# Patient Record
Sex: Male | Born: 1950 | Hispanic: No | Marital: Married | Smoking: Current every day smoker
Health system: Southern US, Community
[De-identification: ages and names within clinical notes are randomized; demographics above are authoritative.]

---

## 2014-09-28 ENCOUNTER — Other Ambulatory Visit: Payer: Self-pay | Admitting: Family Medicine

## 2014-09-28 DIAGNOSIS — M792 Neuralgia and neuritis, unspecified: Secondary | ICD-10-CM

## 2014-09-28 DIAGNOSIS — M542 Cervicalgia: Secondary | ICD-10-CM

## 2014-10-05 ENCOUNTER — Ambulatory Visit
Admission: RE | Admit: 2014-10-05 | Discharge: 2014-10-05 | Disposition: A | Discharge status: Home / Self Care | Payer: BLUE CROSS/BLUE SHIELD | Source: Ambulatory Visit | Attending: Family Medicine | Admitting: Family Medicine

## 2014-10-05 DIAGNOSIS — D170 Benign lipomatous neoplasm of skin and subcutaneous tissue of head, face and neck: Secondary | ICD-10-CM | POA: Insufficient documentation

## 2014-10-05 DIAGNOSIS — M792 Neuralgia and neuritis, unspecified: Secondary | ICD-10-CM | POA: Insufficient documentation

## 2014-10-05 DIAGNOSIS — M542 Cervicalgia: Secondary | ICD-10-CM | POA: Diagnosis present

## 2014-10-05 DIAGNOSIS — M47892 Other spondylosis, cervical region: Secondary | ICD-10-CM | POA: Diagnosis not present

## 2016-03-06 ENCOUNTER — Other Ambulatory Visit: Payer: Self-pay | Admitting: Orthopedic Surgery

## 2016-03-06 DIAGNOSIS — M9951 Intervertebral disc stenosis of neural canal of cervical region: Secondary | ICD-10-CM

## 2016-03-06 DIAGNOSIS — M9941 Connective tissue stenosis of neural canal of cervical region: Secondary | ICD-10-CM

## 2016-03-17 ENCOUNTER — Ambulatory Visit
Admission: RE | Admit: 2016-03-17 | Discharge: 2016-03-17 | Disposition: A | Discharge status: Home / Self Care | Payer: BLUE CROSS/BLUE SHIELD | Source: Ambulatory Visit | Attending: Orthopedic Surgery | Admitting: Orthopedic Surgery

## 2016-03-17 DIAGNOSIS — M9951 Intervertebral disc stenosis of neural canal of cervical region: Secondary | ICD-10-CM | POA: Insufficient documentation

## 2016-03-17 DIAGNOSIS — M4802 Spinal stenosis, cervical region: Secondary | ICD-10-CM | POA: Insufficient documentation

## 2016-03-17 DIAGNOSIS — M50223 Other cervical disc displacement at C6-C7 level: Secondary | ICD-10-CM | POA: Insufficient documentation

## 2016-03-17 DIAGNOSIS — M9941 Connective tissue stenosis of neural canal of cervical region: Secondary | ICD-10-CM | POA: Diagnosis present

## 2016-03-17 DIAGNOSIS — M50323 Other cervical disc degeneration at C6-C7 level: Secondary | ICD-10-CM | POA: Insufficient documentation

## 2016-08-21 ENCOUNTER — Ambulatory Visit
Admission: EM | Admit: 2016-08-21 | Discharge: 2016-08-21 | Disposition: A | Discharge status: Home / Self Care | Payer: BLUE CROSS/BLUE SHIELD | Attending: Family Medicine | Admitting: Family Medicine

## 2016-08-21 DIAGNOSIS — K120 Recurrent oral aphthae: Secondary | ICD-10-CM

## 2016-08-21 MED ORDER — AMOXICILLIN 875 MG PO TABS: 875.0000 mg | ORAL_TABLET | Freq: Two times a day (BID) | ORAL | 0 refills | Status: DC

## 2016-08-21 NOTE — ED Triage Notes (Signed)
66 year old Caucasian male is here today with complaints lower right side mouth pain that started 4-5 days ago. He states he had a deep cleaning  done at his dentist office last Thursday. He states he thought his gum may have been scratched from having xrays done and thought nothing of it until it started irritating him.

## 2016-08-21 NOTE — ED Provider Notes (Signed)
[facility]    CSN: [id number] Arrival date & time: 08/21/16  1402     History   Chief Complaint Chief Complaint  Patient presents with  . Oral Pain    HPI Steve Villarreal is a 66 y.o. male.   66 yo male with a 4-5 days h/o right lower mouth pain. Has been using otc mouthwash and salt water rinses. States had dental work done last week. Denies any fevers or chills.     Oral Pain  This is a new problem.    History reviewed. No pertinent past medical history.  There are no active problems to display for this patient.   History reviewed. No pertinent surgical history.     Home Medications    Prior to Admission medications   Medication Sig Start Date End Date Taking? Authorizing Provider  amoxicillin (AMOXIL) 875 MG tablet Take 1 tablet (875 mg total) by mouth 2 (two) times daily. 08/21/16   Norval Gable, MD    Family History History reviewed. No pertinent family history.  Social History Social History  Substance Use Topics  . Smoking status: Current Every Day Smoker  . Smokeless tobacco: Current User  . Alcohol use Yes     Comment: socially     Allergies   Patient has no known allergies.   Review of Systems Review of Systems   Physical Exam Triage Vital Signs ED Triage Vitals  Enc Vitals Group     BP 08/21/16 1442 (!) 142/75     Pulse Rate 08/21/16 1442 70     Resp 08/21/16 1442 18     Temp 08/21/16 1442 98.6 F (37 C)     Temp Source 08/21/16 1442 Oral     SpO2 08/21/16 1442 99 %     Weight 08/21/16 1443 160 lb (72.6 kg)     Height 08/21/16 1443 5\' 9"  (1.753 m)     Head Circumference --      Peak Flow --      Pain Score 08/21/16 1443 1     Pain Loc --      Pain Edu? --      Excl. in [place]? --    No data found.   Updated Vital Signs BP (!) 142/75 (BP Location: Left Arm)   Pulse 70   Temp 98.6 F (37 C) (Oral)   Resp 18   Ht 5\' 9"  (1.753 m)   Wt 160 lb (72.6 kg)   SpO2 99%   BMI 23.63 kg/m   Visual  Acuity Right Eye Distance:   Left Eye Distance:   Bilateral Distance:    Right Eye Near:   Left Eye Near:    Bilateral Near:     Physical Exam  Constitutional: He appears well-developed and well-nourished. No distress.  HENT:  Mouth/Throat: Oral lesions (large aphthous ulcer on right inner gum line) present.  Skin: He is not diaphoretic.  Vitals reviewed.    UC Treatments / Results  Labs (all labs ordered are listed, but only abnormal results are displayed) Labs Reviewed - No data to display  EKG  EKG Interpretation None       Radiology No results found.  Procedures Procedures (including critical care time)  Medications Ordered in UC Medications - No data to display   Initial Impression / Assessment and Plan / UC Course  I have reviewed the triage vital signs and the nursing notes.  Pertinent labs & imaging results that were available during my care  of the patient were reviewed by me and considered in my medical decision making (see chart for details).       Final Clinical Impressions(s) / UC Diagnoses   Final diagnoses:  Oral aphthous ulcer    New Prescriptions Discharge Medication List as of 08/21/2016  3:11 PM    START taking these medications   Details  amoxicillin (AMOXIL) 875 MG tablet Take 1 tablet (875 mg total) by mouth 2 (two) times daily., Starting Fri 08/21/2016, Normal       1. diagnosis reviewed with patient 2. rx as per orders above; reviewed possible side effects, interactions, risks and benefits  3. Recommend continue mouthwash rinses  4. Follow-up prn if symptoms worsen or don't improve   Norval Gable, MD 08/21/16 [phone]

## 2017-08-09 ENCOUNTER — Other Ambulatory Visit: Payer: Self-pay

## 2017-08-09 ENCOUNTER — Encounter: Payer: Self-pay | Admitting: *Deleted

## 2017-08-09 DIAGNOSIS — F17200 Nicotine dependence, unspecified, uncomplicated: Secondary | ICD-10-CM | POA: Diagnosis not present

## 2017-08-09 DIAGNOSIS — R040 Epistaxis: Secondary | ICD-10-CM | POA: Insufficient documentation

## 2017-08-09 LAB — CBC
HCT: 36.6 % — ABNORMAL LOW (ref 40.0–52.0)
HEMOGLOBIN: 12.7 g/dL — AB (ref 13.0–18.0)
MCH: 32.4 pg (ref 26.0–34.0)
MCHC: 34.6 g/dL (ref 32.0–36.0)
MCV: 93.5 fL (ref 80.0–100.0)
Platelets: 234 10*3/uL (ref 150–440)
RBC: 3.91 MIL/uL — AB (ref 4.40–5.90)
RDW: 15.2 % — ABNORMAL HIGH (ref 11.5–14.5)
WBC: 7.2 10*3/uL (ref 3.8–10.6)

## 2017-08-09 NOTE — ED Triage Notes (Signed)
Pt sneezed tonight and nosebleed began from left nares.  Nose clamp in place.  No bleeding in triage.  Pt alert.

## 2017-08-10 ENCOUNTER — Encounter: Payer: Self-pay | Admitting: Emergency Medicine

## 2017-08-10 ENCOUNTER — Emergency Department
Admission: EM | Admit: 2017-08-10 | Discharge: 2017-08-10 | Disposition: A | Discharge status: Home / Self Care | Payer: 59 | Attending: Emergency Medicine | Admitting: Emergency Medicine

## 2017-08-10 DIAGNOSIS — R040 Epistaxis: Secondary | ICD-10-CM

## 2017-08-10 MED ORDER — OXYMETAZOLINE HCL 0.05 % NA SOLN: 1.0000 | Freq: Two times a day (BID) | NASAL | 0 refills | Status: AC

## 2017-08-10 MED ORDER — OXYMETAZOLINE HCL 0.05 % NA SOLN
1.0000 | Freq: Once | NASAL | Status: AC
  Administered 2017-08-10: 1 via NASAL
  Filled 2017-08-10: qty 15

## 2017-08-10 NOTE — ED Notes (Signed)
Pt's nose bleed has subsided at this time.  Pt reports having a cold and frequently blowing his nose.  Pt denies being on blood thinners.  Pt is A&Ox4, in NAD.

## 2017-08-10 NOTE — ED Provider Notes (Signed)
[facility] Emergency Department Provider Note   ____________________________________________   First MD Initiated Contact with Patient 08/10/17 0222     (approximate)  I have reviewed the triage vital signs and the nursing notes.   HISTORY  Chief Complaint Epistaxis    HPI Steve Villarreal is a 67 y.o. male who comes into the hospital today with a nosebleed.  The patient states that it started on the left and then moved over to the right.  The patient sneezed and then felt something running out of his nose.  When he checked he noticed that it was blood.  He squeezed his nose and tried to use some cold water.  They attempted to stop the nosebleed for about 30 to 45 minutes before coming into the hospital.  The patient's wife states that it was just dripping out of his nose like water.  The patient does not take any blood thinners.  He did feel it going down his throat some and did cough up a large clot.  The patient denies any dizziness or loss of consciousness.  He is here today for evaluation.  History reviewed. No pertinent past medical history.  There are no active problems to display for this patient.   History reviewed. No pertinent surgical history.  Prior to Admission medications   Medication Sig Start Date End Date Taking? Authorizing Provider  amoxicillin (AMOXIL) 875 MG tablet Take 1 tablet (875 mg total) by mouth 2 (two) times daily. 08/21/16   Norval Gable, MD  oxymetazoline (AFRIN) 0.05 % nasal spray Place 1 spray into both nostrils 2 (two) times daily for 5 days. 08/10/17 08/15/17  Loney Hering, MD    Allergies Patient has no known allergies.  No family history on file.  Social History Social History   Tobacco Use  . Smoking status: Current Every Day Smoker  . Smokeless tobacco: Current User  Substance Use Topics  . Alcohol use: Yes    Comment: socially  . Drug use: No    Review of Systems  Constitutional: No  fever/chills Eyes: No visual changes. ENT: nose bleed Cardiovascular: Denies chest pain. Respiratory: Denies shortness of breath. Gastrointestinal: No abdominal pain.  No nausea, no vomiting.  Positive bowel sounds Genitourinary: Negative for dysuria. Musculoskeletal: Negative for back pain. Skin: Negative for rash. Neurological: Negative for headaches, focal weakness or numbness.   ____________________________________________   PHYSICAL EXAM:  VITAL SIGNS: ED Triage Vitals  Enc Vitals Group     BP 08/09/17 2217 (!) 153/78     Pulse Rate 08/09/17 2217 62     Resp 08/09/17 2217 18     Temp 08/09/17 2217 98.6 F (37 C)     Temp Source 08/09/17 2217 Oral     SpO2 08/09/17 2217 99 %     Weight 08/09/17 2218 153 lb (69.4 kg)     Height 08/09/17 2218 5\' 9"  (1.753 m)     Head Circumference --      Peak Flow --      Pain Score 08/09/17 2218 0     Pain Loc --      Pain Edu? --      Excl. in [place]? --     Constitutional: Alert and oriented. Well appearing and in mild distress. Eyes: Conjunctivae are normal. PERRL. EOMI. Head: Atraumatic. Nose: Clear rhinorrhea noticed in patient's nostrils with some small amount of blood on the nasal wall.  No clots noted in the nose and no active  bleeding at this time. Mouth/Throat: Mucous membranes are moist.  Oropharynx non-erythematous. Cardiovascular: Normal rate, regular rhythm. Grossly normal heart sounds.  Good peripheral circulation. Respiratory: Normal respiratory effort.  No retractions. Lungs CTAB. Gastrointestinal: Soft and nontender. No distention. Positive bowel sounds Musculoskeletal: No lower extremity tenderness nor edema.   Neurologic:  Normal speech and language.  Skin:  Skin is warm, dry and intact.  Psychiatric: Mood and affect are normal.   ____________________________________________   LABS (all labs ordered are listed, but only abnormal results are displayed)  Labs Reviewed  CBC - Abnormal; Notable for the following  components:      Result Value   RBC 3.91 (*)    Hemoglobin 12.7 (*)    HCT 36.6 (*)    RDW 15.2 (*)    All other components within normal limits   ____________________________________________  EKG  none ____________________________________________  RADIOLOGY  ED MD interpretation:  none  Official radiology report(s): No results found.  ____________________________________________   PROCEDURES  Procedure(s) performed: None  Procedures  Critical Care performed: No  ____________________________________________   INITIAL IMPRESSION / ASSESSMENT AND PLAN / ED COURSE  As part of my medical decision making, I reviewed the following data within the electronic MEDICAL RECORD NUMBER Notes from prior ED visits and [place] Controlled Substance Database   This is a 67 year old male who comes into the hospital today with a nosebleed.  The patient did have a nasal clamp on his nose for quite some time while waiting in the lobby and it appears that the bleeding has subsided.  I did order some Afrin and had sprayed in the patient's nostrils once each.  We did monitor the patient the emergency department and he had no further bleeding.  He will be discharged home and encouraged to follow-up with ENT.      ____________________________________________   FINAL CLINICAL IMPRESSION(S) / ED DIAGNOSES  Final diagnoses:  Epistaxis     ED Discharge Orders        Ordered    oxymetazoline (AFRIN) 0.05 % nasal spray  2 times daily     08/10/17 0336       Note:  This document was prepared using Dragon voice recognition software and may include unintentional dictation errors.    Loney Hering, MD 08/10/17 [phone]

## 2017-08-10 NOTE — Discharge Instructions (Addendum)
Please follow up with ENT for further evaluation of your nosebleed. Please return with any worsened condition.

## 2017-08-25 ENCOUNTER — Encounter: Payer: Self-pay | Admitting: Emergency Medicine

## 2017-08-25 ENCOUNTER — Ambulatory Visit
Admission: EM | Admit: 2017-08-25 | Discharge: 2017-08-25 | Disposition: A | Discharge status: Home / Self Care | Payer: 59 | Attending: Family Medicine | Admitting: Family Medicine

## 2017-08-25 ENCOUNTER — Other Ambulatory Visit: Payer: Self-pay

## 2017-08-25 DIAGNOSIS — J0100 Acute maxillary sinusitis, unspecified: Secondary | ICD-10-CM | POA: Diagnosis not present

## 2017-08-25 MED ORDER — AMOXICILLIN-POT CLAVULANATE 875-125 MG PO TABS: 1.0000 | ORAL_TABLET | Freq: Two times a day (BID) | ORAL | 0 refills | Status: AC

## 2017-08-25 NOTE — ED Provider Notes (Signed)
[facility] ____________________________________________  Time seen: Approximately 10:28 AM  I have reviewed the triage vital signs and the nursing notes.   HISTORY  Chief Complaint Sinus Problem   HPI Steve Villarreal is a 67 y.o. male presenting for evaluation of nasal congestion, sinus pressure sensation and some dental discomfort present for the last 1 week.  Reports approximately 2 weeks ago he was seen in the emergency room for a nosebleed.  States that this is since resolved and only occasionally has some blood when forcefully blowing his nose after use of saline rinse.  Has been using over-the-counter saline rinses as well as site with slight improvement but no resolution of nasal congestion.  Denies associated fever, sore throat, cough or chest congestion.  Continues to eat and drink well.  Reports his daughter's fianc had some recent sickness, denies other known sick contacts.  Denies other aggravating or alleviating factors.  Reports otherwise feels well.  Denies chest pain, shortness of breath, abdominal pain, or rash. Denies recent sickness. Denies recent antibiotic use.   Sofie Hartigan, MD: PCP   History reviewed. No pertinent past medical history.  There are no active problems to display for this patient.   History reviewed. No pertinent surgical history.   No current facility-administered medications for this encounter.   Current Outpatient Medications:  .  amoxicillin-clavulanate (AUGMENTIN) 875-125 MG tablet, Take 1 tablet by mouth every 12 (twelve) hours., Disp: 20 tablet, Rfl: 0  Allergies Patient has no known allergies.  History reviewed. No pertinent family history.  Social History Social History   Tobacco Use  . Smoking status: Current Every Day Smoker    Types: Cigarettes  . Smokeless tobacco: Never Used  Substance Use Topics  . Alcohol use: Yes    Comment: socially  . Drug use: No    Review of  Systems Constitutional: No fever ENT: No sore throat. AS above.  Cardiovascular: Denies chest pain. Respiratory: Denies shortness of breath. Gastrointestinal: No abdominal pain.   Musculoskeletal: Negative for back pain. Skin: Negative for rash.   ____________________________________________   PHYSICAL EXAM:  VITAL SIGNS: ED Triage Vitals  Enc Vitals Group     BP 08/25/17 0959 132/83     Pulse Rate 08/25/17 0959 62     Resp 08/25/17 0959 16     Temp 08/25/17 0959 98.4 F (36.9 C)     Temp Source 08/25/17 0959 Oral     SpO2 08/25/17 0959 99 %     Weight 08/25/17 0957 150 lb (68 kg)     Height 08/25/17 0957 5\' 9"  (1.753 m)     Head Circumference --      Peak Flow --      Pain Score 08/25/17 0956 4     Pain Loc --      Pain Edu? --      Excl. in [place]? --    Constitutional: Alert and oriented. Well appearing and in no acute distress. Eyes: Conjunctivae are normal. Head: Atraumatic.Mildtenderness to palpation maxillary sinuses L>R, no frontal sinus tenderness palpation.. No swelling. No erythema.   Ears: no erythema, normal TMs bilaterally.   Nose: nasal congestion with bilateral nasal turbinate erythema and edema.  No epistaxis, no dried blood.  Mouth/Throat: Mucous membranes are moist.  Oropharynx non-erythematous.No tonsillar swelling or exudate.  Left upper premolars mild gumline erythema, no edema, no visualized or palpated abscess, nontender to direct palpation.   Neck: No stridor.  No cervical spine tenderness to palpation. Hematological/Lymphatic/Immunilogical: No  cervical lymphadenopathy. Cardiovascular: Normal rate, regular rhythm. Grossly normal heart sounds. Good peripheral circulation. Respiratory: Normal respiratory effort.  No retractions.  No wheezes, rales or rhonchi. Good air movement.  Musculoskeletal: Steady gait.  Neurologic:  Normal speech and language.No gait instability. Skin:  Skin is warm, dry and intact. No rash noted. Psychiatric: Mood and affect  are normal. Speech and behavior are normal.  ___________________________________________   LABS (all labs ordered are listed, but only abnormal results are displayed)  Labs Reviewed - No data to display  PROCEDURES Procedures   INITIAL IMPRESSION / ASSESSMENT AND PLAN / ED COURSE  Pertinent labs & imaging results that were available during my care of the patient were reviewed by me and considered in my medical decision making (see chart for details).  Well-appearing patient.  No acute distress.Suspect sinusitis.  Will treat with oral Augmentin.  Encourage rest, fluids, supportive care.  Encourage PCP and dentist follow-up as needed.Discussed indication, risks and benefits of medications with patient.  Discussed follow up with Primary care physician this week. Discussed follow up and return parameters including no resolution or any worsening concerns. Patient verbalized understanding and agreed to plan.   ____________________________________________   FINAL CLINICAL IMPRESSION(S) / ED DIAGNOSES  Final diagnoses:  Acute maxillary sinusitis, recurrence not specified     ED Discharge Orders        Ordered    amoxicillin-clavulanate (AUGMENTIN) 875-125 MG tablet  Every 12 hours     08/25/17 1028       Note: This dictation was prepared with Dragon dictation along with smaller phrase technology. Any transcriptional errors that result from this process are unintentional.         Marylene Land, NP 08/25/17 1039

## 2017-08-25 NOTE — ED Triage Notes (Signed)
Patient c/o sinus congestion and pain and also dental pain that started a week ago.

## 2017-08-25 NOTE — Discharge Instructions (Addendum)
Take medication as prescribed. Rest. Drink plenty of fluids. Over the counter claritin or zyrtec.   Follow up with your primary care physician this week as needed. Return to Urgent care for new or worsening concerns.

## 2018-09-10 IMAGING — MR MR CERVICAL SPINE W/O CM
5 series · 32 of 48 positions shown · non-contrast
Comparison: 10/05/2014

CLINICAL DATA: Left-sided neck pain. Left shoulder and arm pain
with hand numbness. Symptoms since [DATE].

EXAM:
MRI CERVICAL SPINE WITHOUT CONTRAST
TECHNIQUE: Multiplanar, multisequence MR imaging of the cervical spine was
performed. No intravenous contrast was administered.

[Series 2: T2 · sagittal · 3.0mm · 0.56mm/px · 6 of 13 slices shown (1 of 2)]
[im 1/13]
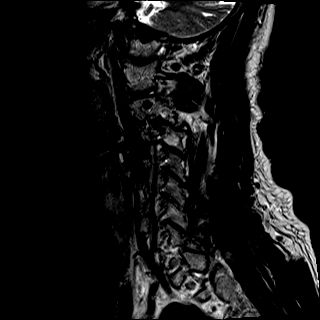
[im 3/13]
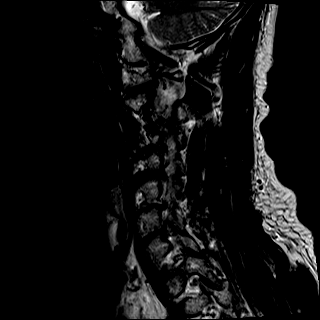
[im 5/13]
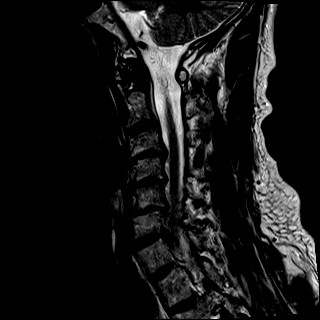
[im 8/13]
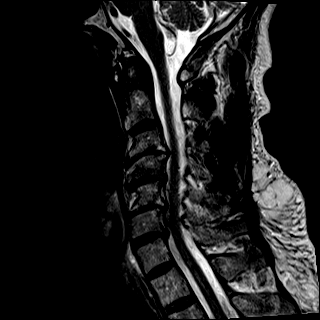
[im 10/13]
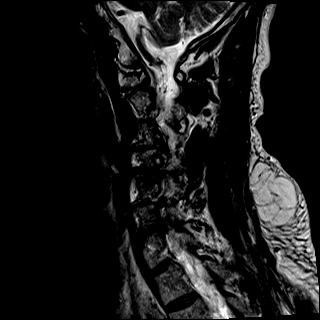
[im 13/13]
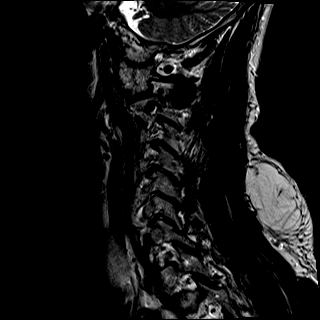

[Series 3: T1 · sagittal · 3.0mm · 0.70mm/px · 7 of 13 slices shown]
[im 1/13]
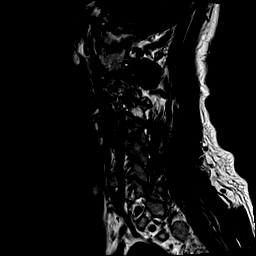
[im 3/13]
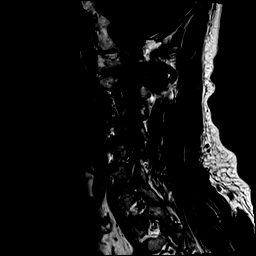
[im 5/13]
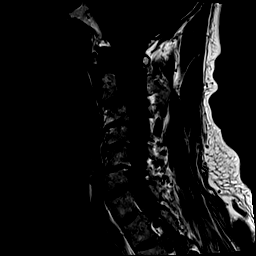
[im 7/13]
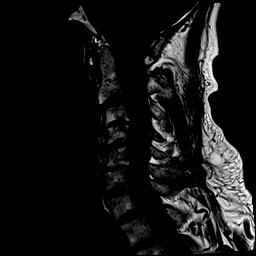
[im 9/13]
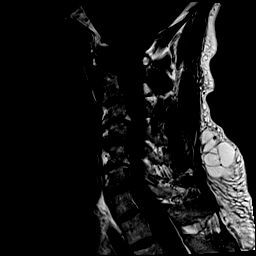
[im 11/13]
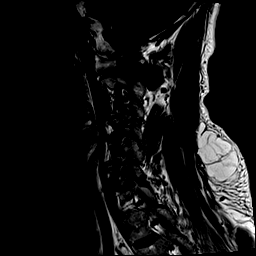
[im 13/13]
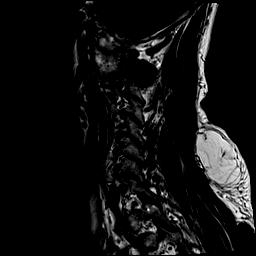

[Series 4: STIR · sagittal · 3.0mm · 0.35mm/px · 7 of 13 slices shown]
[im 1/13]
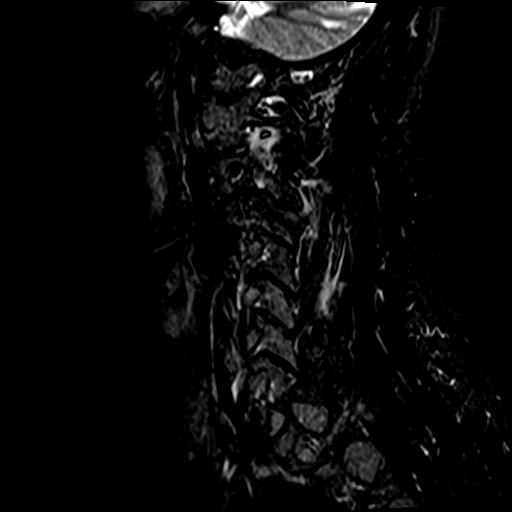
[im 3/13]
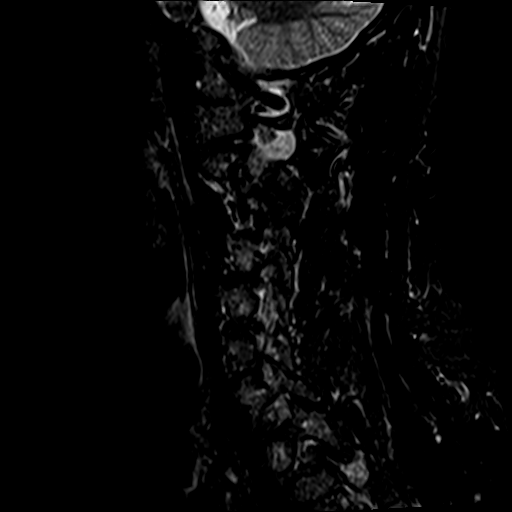
[im 5/13]
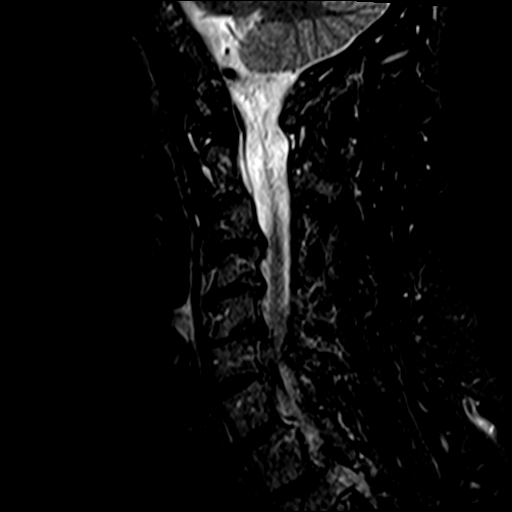
[im 7/13]
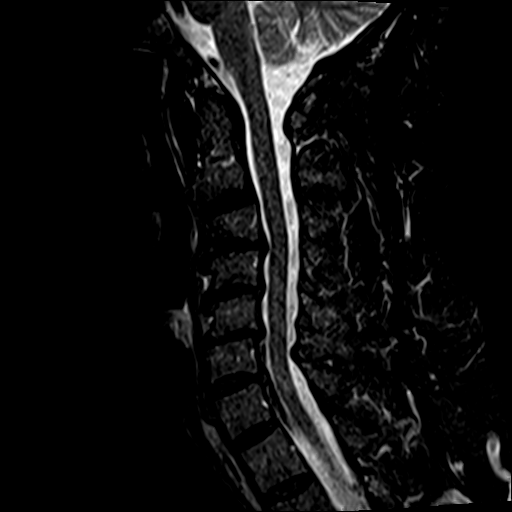
[im 9/13]
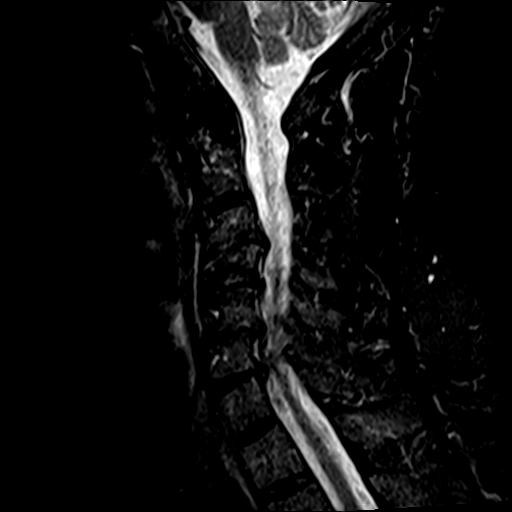
[im 11/13]
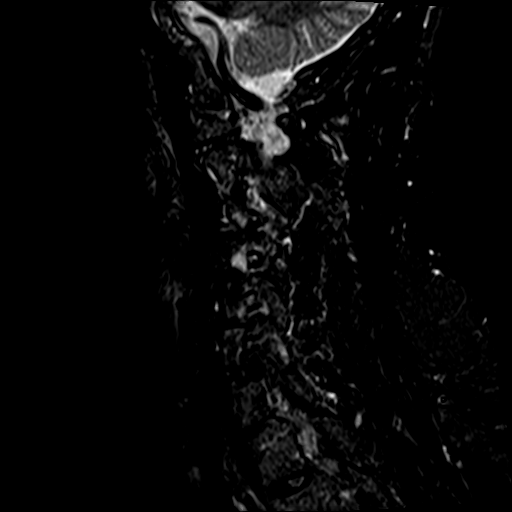
[im 13/13]
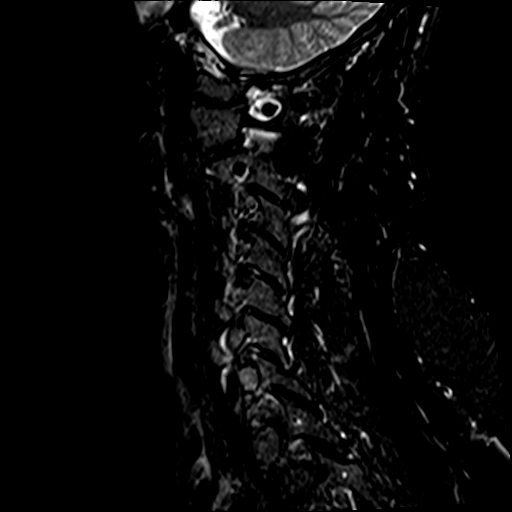

[Series 5: T2 · axial · 3.0mm · 0.62mm/px · z∈[-90,+3]mm · 8 of 26 slices shown (2 of 2)]
[im 1/26]
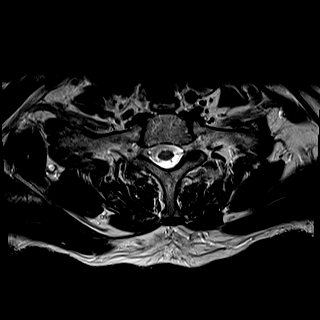
[im 4/26]
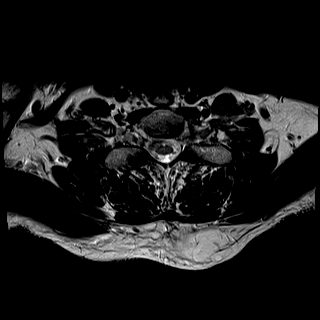
[im 8/26]
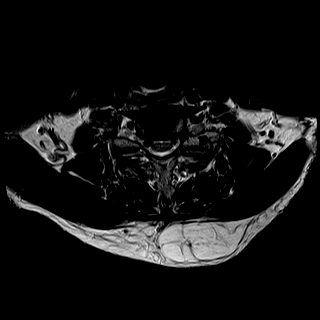
[im 12/26]
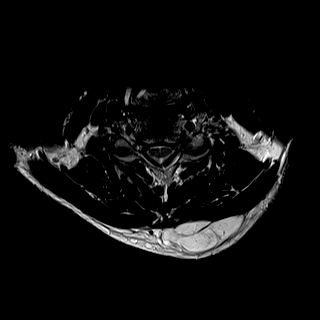
[im 14/26]
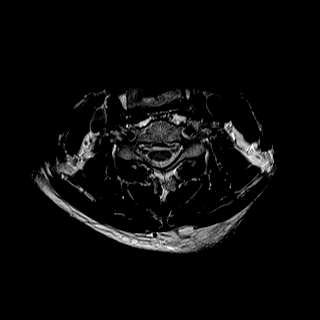
[im 18/26]
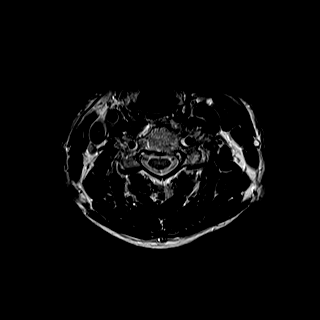
[im 22/26]
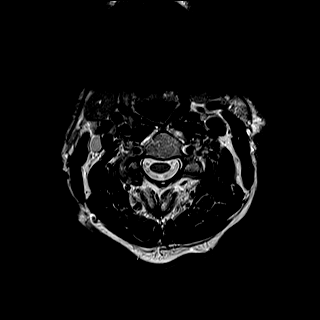
[im 26/26]
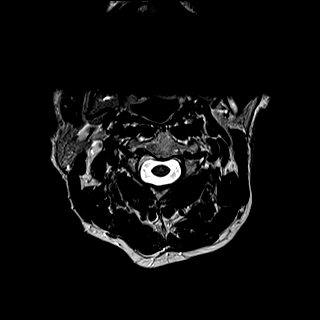

[Series 6: mpgr ax · axial · 3.0mm · 0.35mm/px · z∈[-81,-40]mm · 4 of 26 slices shown]
[im 1/26]
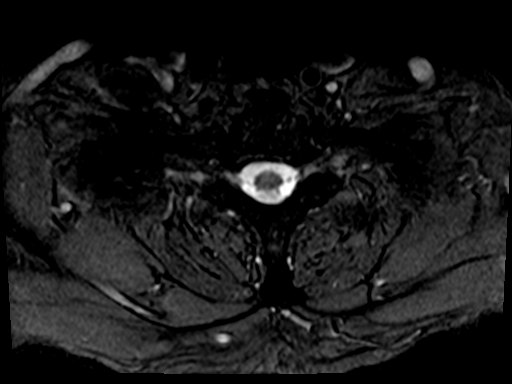
[im 4/26]
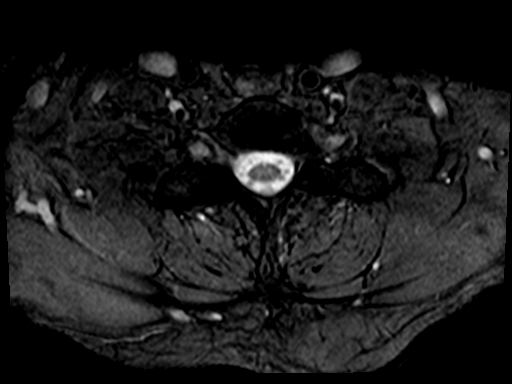
[im 8/26]
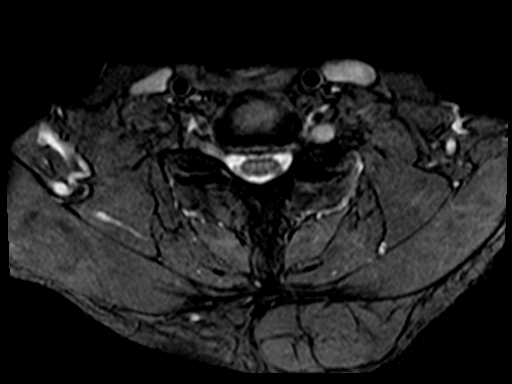
[im 12/26]
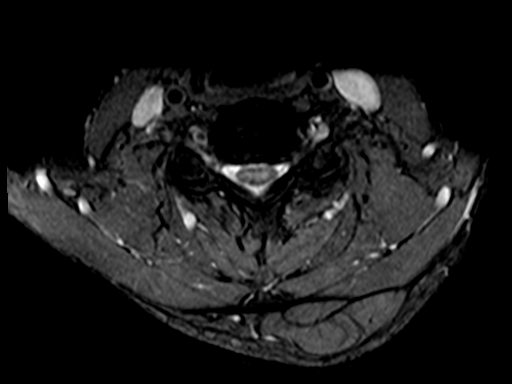

[32 of 48 positions shown; findings below may reference images not displayed]

FINDINGS: Alignment: Unchanged reversal the normal cervical lordosis with
minimal retrolisthesis of C4 on C5 and C5 on C6.

Vertebrae: No evidence of fracture, suspicious osseous lesion, or
significant marrow edema. Multilevel type 2 degenerative endplate
changes are similar to the prior study.

Cord: Normal signal and morphology.

Posterior Fossa, vertebral arteries, paraspinal tissues: A
subcutaneous lipoma in the posterior left lower neck has slightly
increased in size, measuring 5.6 x 2.6 cm.

Disc levels:

C2-3: Severe right facet arthrosis results in minimal right neural
foraminal narrowing, unchanged. No spinal stenosis.

C3-4: Severe disc space narrowing. Broad-based posterior disc
osteophyte complex results in mild right and severe left neural
foraminal stenosis without spinal stenosis, unchanged.

C4-5: Moderate to severe disc space narrowing. Broad-based posterior
disc osteophyte complex results in mild right and moderate left
neural foraminal stenosis without spinal stenosis, unchanged.

C5-6: Severe disc space narrowing. Broad-based posterior disc
osteophyte complex results in mild right and moderate left neural
foraminal stenosis and borderline spinal stenosis, unchanged.

C6-7: Disc bulging and a new or larger left foraminal disc
protrusion result in moderate left neural foraminal stenosis with
potential left C7 nerve root impingement. There is a small right
paracentral disc extrusion which is larger than on the prior study
but does not result in significant spinal stenosis. Mild right
foraminal stenosis.

C7-T1:  Negative.
IMPRESSION: 1. Progressive C6-7 disc degeneration including a left foraminal
disc protrusion with foraminal stenosis and potential left C7 nerve
root impingement.
2. Unchanged disc and facet degeneration elsewhere as above.
